# Patient Record
Sex: Male | Born: 1935 | Race: White | Hispanic: No | Marital: Married
Health system: Southern US, Community
[De-identification: ages and names within clinical notes are randomized; demographics above are authoritative.]

---

## 2004-02-22 ENCOUNTER — Ambulatory Visit: Payer: Self-pay

## 2006-08-23 ENCOUNTER — Ambulatory Visit: Payer: Self-pay | Admitting: Specialist

## 2007-01-29 ENCOUNTER — Ambulatory Visit: Payer: Self-pay | Admitting: Specialist

## 2007-07-18 ENCOUNTER — Ambulatory Visit: Payer: Self-pay | Admitting: Specialist

## 2008-08-25 ENCOUNTER — Ambulatory Visit: Payer: Self-pay | Admitting: Specialist

## 2008-08-28 ENCOUNTER — Emergency Department: Payer: Self-pay | Admitting: Emergency Medicine

## 2008-10-09 ENCOUNTER — Emergency Department: Payer: Self-pay | Admitting: Emergency Medicine

## 2008-10-21 ENCOUNTER — Ambulatory Visit: Payer: Self-pay | Admitting: Podiatry

## 2009-02-24 ENCOUNTER — Emergency Department: Payer: Self-pay | Admitting: Emergency Medicine

## 2009-05-27 ENCOUNTER — Inpatient Hospital Stay: Payer: Self-pay | Admitting: General Surgery

## 2009-05-28 ENCOUNTER — Ambulatory Visit: Payer: Self-pay | Admitting: Internal Medicine

## 2009-05-30 ENCOUNTER — Ambulatory Visit: Payer: Self-pay | Admitting: Internal Medicine

## 2009-06-07 ENCOUNTER — Ambulatory Visit: Payer: Self-pay | Admitting: Internal Medicine

## 2009-06-08 ENCOUNTER — Ambulatory Visit: Payer: Self-pay | Admitting: General Surgery

## 2009-06-30 ENCOUNTER — Ambulatory Visit: Payer: Self-pay | Admitting: Internal Medicine

## 2009-09-15 ENCOUNTER — Ambulatory Visit: Payer: Self-pay | Admitting: Internal Medicine

## 2009-09-30 ENCOUNTER — Ambulatory Visit: Payer: Self-pay | Admitting: Internal Medicine

## 2009-10-30 ENCOUNTER — Ambulatory Visit: Payer: Self-pay | Admitting: Internal Medicine

## 2009-11-23 ENCOUNTER — Ambulatory Visit: Payer: Self-pay | Admitting: Specialist

## 2010-03-02 ENCOUNTER — Ambulatory Visit: Payer: Self-pay | Admitting: Internal Medicine

## 2010-03-31 ENCOUNTER — Ambulatory Visit: Payer: Self-pay | Admitting: Internal Medicine

## 2010-05-25 ENCOUNTER — Ambulatory Visit: Payer: Self-pay | Admitting: Internal Medicine

## 2010-05-31 ENCOUNTER — Ambulatory Visit: Payer: Self-pay | Admitting: Internal Medicine

## 2010-07-20 ENCOUNTER — Ambulatory Visit: Payer: Self-pay | Admitting: Internal Medicine

## 2010-07-31 ENCOUNTER — Ambulatory Visit: Payer: Self-pay | Admitting: Internal Medicine

## 2010-09-14 ENCOUNTER — Ambulatory Visit: Payer: Self-pay | Admitting: Internal Medicine

## 2010-10-01 ENCOUNTER — Ambulatory Visit: Payer: Self-pay | Admitting: Internal Medicine

## 2010-10-31 ENCOUNTER — Ambulatory Visit: Payer: Self-pay | Admitting: Internal Medicine

## 2010-12-01 ENCOUNTER — Ambulatory Visit: Payer: Self-pay | Admitting: Internal Medicine

## 2011-01-04 ENCOUNTER — Ambulatory Visit: Payer: Self-pay | Admitting: Internal Medicine

## 2011-01-31 ENCOUNTER — Ambulatory Visit: Payer: Self-pay | Admitting: Internal Medicine

## 2011-03-22 ENCOUNTER — Ambulatory Visit: Payer: Self-pay | Admitting: Specialist

## 2011-03-29 ENCOUNTER — Ambulatory Visit: Payer: Self-pay | Admitting: Internal Medicine

## 2011-03-29 LAB — CBC CANCER CENTER
Eosinophil #: 0 x10 3/mm (ref 0.0–0.7)
Eosinophil %: 0.8 %
HCT: 35.5 % — ABNORMAL LOW (ref 40.0–52.0)
Lymphocyte #: 0.7 x10 3/mm — ABNORMAL LOW (ref 1.0–3.6)
MCH: 28.3 pg (ref 26.0–34.0)
MCHC: 32.9 g/dL (ref 32.0–36.0)
MCV: 86 fL (ref 80–100)
Monocyte #: 0.3 x10 3/mm (ref 0.0–0.7)
Neutrophil #: 2.9 x10 3/mm (ref 1.4–6.5)
RDW: 17.1 % — ABNORMAL HIGH (ref 11.5–14.5)

## 2011-03-31 ENCOUNTER — Ambulatory Visit: Payer: Self-pay | Admitting: Internal Medicine

## 2011-04-04 LAB — CBC CANCER CENTER
Basophil #: 0 x10 3/mm (ref 0.0–0.1)
Basophil %: 0.2 %
Eosinophil #: 0 x10 3/mm (ref 0.0–0.7)
HCT: 34.2 % — ABNORMAL LOW (ref 40.0–52.0)
HGB: 11.2 g/dL — ABNORMAL LOW (ref 13.0–18.0)
Lymphocyte %: 16.8 %
MCH: 28.7 pg (ref 26.0–34.0)
MCHC: 32.9 g/dL (ref 32.0–36.0)
Monocyte %: 7.2 %
Neutrophil #: 3.5 x10 3/mm (ref 1.4–6.5)
Neutrophil %: 75.1 %
WBC: 4.7 x10 3/mm (ref 3.8–10.6)

## 2011-05-01 ENCOUNTER — Ambulatory Visit: Payer: Self-pay | Admitting: Internal Medicine

## 2011-06-21 ENCOUNTER — Ambulatory Visit: Payer: Self-pay | Admitting: Internal Medicine

## 2011-06-21 LAB — IRON AND TIBC
Iron: 42 ug/dL — ABNORMAL LOW (ref 65–175)
Unbound Iron-Bind.Cap.: 318 ug/dL

## 2011-06-21 LAB — FERRITIN: Ferritin (ARMC): 29 ng/mL (ref 8–388)

## 2011-06-21 LAB — CBC CANCER CENTER
Basophil #: 0 x10 3/mm (ref 0.0–0.1)
Basophil %: 0.4 %
Eosinophil %: 0.6 %
Lymphocyte %: 22.2 %
MCH: 27.9 pg (ref 26.0–34.0)
MCHC: 31.3 g/dL — ABNORMAL LOW (ref 32.0–36.0)
Monocyte #: 0.5 x10 3/mm (ref 0.2–1.0)
Neutrophil #: 3.6 x10 3/mm (ref 1.4–6.5)
Neutrophil %: 67.5 %
Platelet: 42 x10 3/mm — ABNORMAL LOW (ref 150–440)
RBC: 4.23 10*6/uL — ABNORMAL LOW (ref 4.40–5.90)
RDW: 16.5 % — ABNORMAL HIGH (ref 11.5–14.5)
WBC: 5.4 x10 3/mm (ref 3.8–10.6)

## 2011-07-01 ENCOUNTER — Ambulatory Visit: Payer: Self-pay | Admitting: Internal Medicine

## 2011-07-31 ENCOUNTER — Ambulatory Visit: Payer: Self-pay | Admitting: Internal Medicine

## 2011-09-04 ENCOUNTER — Encounter: Payer: Self-pay | Admitting: Otolaryngology

## 2011-09-13 ENCOUNTER — Ambulatory Visit: Payer: Self-pay | Admitting: Internal Medicine

## 2011-09-13 LAB — CBC CANCER CENTER
Eosinophil #: 0.1 x10 3/mm (ref 0.0–0.7)
HCT: 39 % — ABNORMAL LOW (ref 40.0–52.0)
Lymphocyte %: 24.7 %
MCHC: 31.2 g/dL — ABNORMAL LOW (ref 32.0–36.0)
MCV: 90 fL (ref 80–100)
Monocyte %: 8.4 %
Neutrophil #: 3 x10 3/mm (ref 1.4–6.5)
Neutrophil %: 65.1 %
Platelet: 61 x10 3/mm — ABNORMAL LOW (ref 150–440)
RDW: 17.9 % — ABNORMAL HIGH (ref 11.5–14.5)
WBC: 4.6 x10 3/mm (ref 3.8–10.6)

## 2011-09-13 LAB — RETICULOCYTES: Absolute Retic Count: 0.0389 10*6/uL (ref 0.031–0.129)

## 2011-10-01 ENCOUNTER — Encounter: Payer: Self-pay | Admitting: Otolaryngology

## 2011-10-01 ENCOUNTER — Ambulatory Visit: Payer: Self-pay | Admitting: Internal Medicine

## 2011-10-18 ENCOUNTER — Other Ambulatory Visit: Payer: Self-pay | Admitting: Internal Medicine

## 2011-10-18 LAB — PROTIME-INR
INR: 1.1
Prothrombin Time: 14.2 secs (ref 11.5–14.7)

## 2011-12-06 ENCOUNTER — Ambulatory Visit: Payer: Self-pay | Admitting: Internal Medicine

## 2011-12-06 LAB — CBC CANCER CENTER
Basophil %: 0.5 %
Eosinophil #: 0.1 x10 3/mm (ref 0.0–0.7)
Eosinophil %: 1.6 %
HCT: 35.9 % — ABNORMAL LOW (ref 40.0–52.0)
Lymphocyte #: 0.8 x10 3/mm — ABNORMAL LOW (ref 1.0–3.6)
MCH: 28.8 pg (ref 26.0–34.0)
MCHC: 31.4 g/dL — ABNORMAL LOW (ref 32.0–36.0)
MCV: 92 fL (ref 80–100)
Monocyte #: 0.3 x10 3/mm (ref 0.2–1.0)
Neutrophil #: 2.7 x10 3/mm (ref 1.4–6.5)
Platelet: 102 x10 3/mm — ABNORMAL LOW (ref 150–440)
RDW: 17 % — ABNORMAL HIGH (ref 11.5–14.5)

## 2011-12-31 ENCOUNTER — Ambulatory Visit: Payer: Self-pay | Admitting: Internal Medicine

## 2012-01-17 ENCOUNTER — Emergency Department: Payer: Self-pay | Admitting: Emergency Medicine

## 2012-01-31 ENCOUNTER — Ambulatory Visit: Payer: Self-pay | Admitting: Internal Medicine

## 2012-03-02 ENCOUNTER — Ambulatory Visit: Payer: Self-pay | Admitting: Internal Medicine

## 2012-03-20 LAB — IRON AND TIBC
Iron Bind.Cap.(Total): 304 ug/dL (ref 250–450)
Iron: 33 ug/dL — ABNORMAL LOW (ref 65–175)
Unbound Iron-Bind.Cap.: 271 ug/dL

## 2012-03-20 LAB — FERRITIN: Ferritin (ARMC): 39 ng/mL (ref 8–388)

## 2012-03-30 ENCOUNTER — Ambulatory Visit: Payer: Self-pay | Admitting: Internal Medicine

## 2012-04-09 ENCOUNTER — Inpatient Hospital Stay: Payer: Self-pay | Admitting: Internal Medicine

## 2012-04-09 LAB — TROPONIN I: Troponin-I: 0.03 ng/mL

## 2012-04-09 LAB — URINALYSIS, COMPLETE
Glucose,UR: NEGATIVE mg/dL (ref 0–75)
Nitrite: NEGATIVE
Protein: 30
RBC,UR: 21 /HPF (ref 0–5)
Specific Gravity: 1.023 (ref 1.003–1.030)
WBC UR: 239 /HPF (ref 0–5)

## 2012-04-09 LAB — COMPREHENSIVE METABOLIC PANEL
Albumin: 2.6 g/dL — ABNORMAL LOW (ref 3.4–5.0)
Alkaline Phosphatase: 73 U/L (ref 50–136)
Anion Gap: 2 — ABNORMAL LOW (ref 7–16)
Bilirubin,Total: 0.5 mg/dL (ref 0.2–1.0)
Calcium, Total: 8.1 mg/dL — ABNORMAL LOW (ref 8.5–10.1)
Chloride: 107 mmol/L (ref 98–107)
Co2: 30 mmol/L (ref 21–32)
Creatinine: 0.79 mg/dL (ref 0.60–1.30)
EGFR (African American): >60
Glucose: 108 mg/dL — ABNORMAL HIGH (ref 65–99)
Sodium: 139 mmol/L (ref 136–145)
Total Protein: 6.8 g/dL (ref 6.4–8.2)

## 2012-04-09 LAB — CBC
HCT: 36.4 % — ABNORMAL LOW (ref 40.0–52.0)
MCH: 27.9 pg (ref 26.0–34.0)
MCHC: 31.4 g/dL — ABNORMAL LOW (ref 32.0–36.0)
MCV: 89 fL (ref 80–100)
RBC: 4.09 10*6/uL — ABNORMAL LOW (ref 4.40–5.90)
RDW: 17.2 % — ABNORMAL HIGH (ref 11.5–14.5)
WBC: 12.8 10*3/uL — ABNORMAL HIGH (ref 3.8–10.6)

## 2012-04-09 LAB — CK: CK, Total: 32 U/L — ABNORMAL LOW (ref 35–232)

## 2012-04-10 LAB — BASIC METABOLIC PANEL
Anion Gap: 4 — ABNORMAL LOW (ref 7–16)
BUN: 26 mg/dL — ABNORMAL HIGH (ref 7–18)
Chloride: 105 mmol/L (ref 98–107)
Co2: 30 mmol/L (ref 21–32)
Creatinine: 0.93 mg/dL (ref 0.60–1.30)
EGFR (African American): >60
EGFR (Non-African Amer.): >60
Sodium: 139 mmol/L (ref 136–145)

## 2012-04-10 LAB — CBC WITH DIFFERENTIAL/PLATELET
Basophil #: 0 10*3/uL (ref 0.0–0.1)
Basophil %: 0.1 %
Eosinophil #: 0 10*3/uL (ref 0.0–0.7)
HCT: 34.3 % — ABNORMAL LOW (ref 40.0–52.0)
HGB: 11.3 g/dL — ABNORMAL LOW (ref 13.0–18.0)
MCH: 29.3 pg (ref 26.0–34.0)
MCV: 89 fL (ref 80–100)
Monocyte #: 0.5 x10 3/mm (ref 0.2–1.0)
Monocyte %: 7.3 %
Neutrophil #: 5.3 10*3/uL (ref 1.4–6.5)
Neutrophil %: 84.6 %
Platelet: 52 10*3/uL — ABNORMAL LOW (ref 150–440)
RBC: 3.87 10*6/uL — ABNORMAL LOW (ref 4.40–5.90)
RDW: 17 % — ABNORMAL HIGH (ref 11.5–14.5)

## 2012-04-11 LAB — CBC WITH DIFFERENTIAL/PLATELET
Basophil #: 0 10*3/uL (ref 0.0–0.1)
Basophil %: 0 %
Eosinophil #: 0 10*3/uL (ref 0.0–0.7)
HCT: 33.9 % — ABNORMAL LOW (ref 40.0–52.0)
Lymphocyte #: 0.1 10*3/uL — ABNORMAL LOW (ref 1.0–3.6)
Lymphocyte %: 2.1 %
MCH: 27.8 pg (ref 26.0–34.0)
MCV: 89 fL (ref 80–100)
Monocyte #: 0.1 x10 3/mm — ABNORMAL LOW (ref 0.2–1.0)
Monocyte %: 0.9 %
Neutrophil #: 5.8 10*3/uL (ref 1.4–6.5)
RBC: 3.82 10*6/uL — ABNORMAL LOW (ref 4.40–5.90)
RDW: 16.4 % — ABNORMAL HIGH (ref 11.5–14.5)
WBC: 6 10*3/uL (ref 3.8–10.6)

## 2012-04-11 LAB — BASIC METABOLIC PANEL
Anion Gap: 2 — ABNORMAL LOW (ref 7–16)
Calcium, Total: 8.4 mg/dL — ABNORMAL LOW (ref 8.5–10.1)
Co2: 32 mmol/L (ref 21–32)
Creatinine: 0.89 mg/dL (ref 0.60–1.30)
EGFR (African American): >60
EGFR (Non-African Amer.): >60
Glucose: 163 mg/dL — ABNORMAL HIGH (ref 65–99)
Osmolality: 284 (ref 275–301)

## 2012-04-11 LAB — URINE CULTURE

## 2012-04-12 LAB — EXPECTORATED SPUTUM ASSESSMENT W GRAM STAIN, RFLX TO RESP C

## 2012-04-12 LAB — CBC WITH DIFFERENTIAL/PLATELET
Basophil #: 0 10*3/uL (ref 0.0–0.1)
Eosinophil #: 0 10*3/uL (ref 0.0–0.7)
Eosinophil %: 0 %
HGB: 10.9 g/dL — ABNORMAL LOW (ref 13.0–18.0)
Lymphocyte %: 1.3 %
MCH: 28.1 pg (ref 26.0–34.0)
Neutrophil #: 9 10*3/uL — ABNORMAL HIGH (ref 1.4–6.5)
Platelet: 72 10*3/uL — ABNORMAL LOW (ref 150–440)
RBC: 3.89 10*6/uL — ABNORMAL LOW (ref 4.40–5.90)
RDW: 16.7 % — ABNORMAL HIGH (ref 11.5–14.5)

## 2012-04-12 LAB — BASIC METABOLIC PANEL
Anion Gap: 1 — ABNORMAL LOW (ref 7–16)
BUN: 25 mg/dL — ABNORMAL HIGH (ref 7–18)
Calcium, Total: 8.3 mg/dL — ABNORMAL LOW (ref 8.5–10.1)
Co2: 34 mmol/L — ABNORMAL HIGH (ref 21–32)
Creatinine: 0.83 mg/dL (ref 0.60–1.30)
EGFR (African American): >60
EGFR (Non-African Amer.): >60
Glucose: 152 mg/dL — ABNORMAL HIGH (ref 65–99)
Osmolality: 279 (ref 275–301)
Potassium: 4.7 mmol/L (ref 3.5–5.1)

## 2012-04-14 LAB — CULTURE, BLOOD (SINGLE)

## 2012-06-30 DEATH — deceased

## 2014-05-22 NOTE — Discharge Summary (Signed)
Dates of Admission and Diagnosis:  Date of Admission 09-Apr-2012   Date of Discharge 12-Apr-2012   Admitting Diagnosis conbfusion   Final Diagnosis acute on chronic respiratory failure   Discharge Diagnosis 1 copd flair   2 cardiomyopathy with effusion   3 Endobronchial mass suggested by ct   4 encephalopathy    Chief Complaint/History of Present Illness see h and p     Hepatic:  11-Mar-14 00:29   Bilirubin, Total 0.5  Alkaline Phosphatase 73  SGPT (ALT) 15  SGOT (AST) 15  Total Protein, Serum 6.8  Albumin, Serum  2.6  LabUnknown:  12-Mar-14 08:47   Ind. Clindamycin Resistance NEGATIVE  Routine Micro:  11-Mar-14 00:29   Micro Text Report URINE CULTURE   COMMENT                   MIXED BACTERIAL ORGANISMS   COMMENT                   RESULTS SUGGESTIVE OF CONTAMINATION   ANTIBIOTIC                       Culture Comment MIXED BACTERIAL ORGANISMS  Specimen Source CLEAN CATCH  Culture Comment . RESULTS SUGGESTIVE OF CONTAMINATION  Result(s) reported on 11 Apr 2012 at 10:36AM.    04:04   Micro Text Report BLOOD CULTURE   COMMENT                   NO GROWTH IN 48 HOURS   ANTIBIOTIC                       Micro Text Report BLOOD CULTURE   COMMENT                   NO GROWTH IN 48 HOURS   ANTIBIOTIC                       Culture Comment NO GROWTH IN 72 HOURS  Result(s) reported on 11 Apr 2012 at 07:07AM.  Culture Comment NO GROWTH IN 48 HOURS  Result(s) reported on 11 Apr 2012 at 07:07AM.  12-Mar-14 08:47   Organism Name STAPHYLOCOCCUS AUREUS  Organism Quantity MODERATE GROWTH  Clindamycin Sensitivity S  Oxacillin Sensitivity S  Ciprofloxacin Sensitivity R  Gentamicin Sensitivity S  Erythromycin Sensitivity R  Linezolid Sensitivity S  Tigecycline Sensitivity S  Trimethoprim/Sulfamethoxazole Sensitivty S  Cefoxitin Scrn. NEGATIVE  Lefofloxacin Sensitivity R  Micro Text Report SPUTUM CULTURE   ORGANISM 1                MODERATE GROWTH STAPHYLOCOCCUS  AUREUS   COMMENT                   -   GRAM STAIN                GOOD SPECIMEN-80-90% WBC   GRAM STAIN                MANY WHITE BLOOD CELLS   GRAM STAIN                MODERATE GRAM POSITIVE COCCI IN PAIRS   GRAM STAIN                MODERATE GRAM POSITIVE COCCI IN CLUSTERS   ANTIBIOTIC  ORG#1     CIPROFLOXACIN                 R         CLINDAMYCIN                   S         ERYTHROMYCIN           R         GENTAMICIN                    S         LEVOFLOXACIN                  R         LINEZOLID                     S         OXACILLIN                     S         TIGECYCLINE                   S         CEFOXITIN SCREEN      NEGATIVE  INDUCIBLE CLINDAMYCIN RESISTANNEGATIVE  TRIMETHOPRIM/SULFAMETHOXAZOLE S  Organism 1 MODERATE GROWTH STAPHYLOCOCCUS AUREUS  Culture Comment -  Gram Stain 1 GOOD SPECIMEN-80-90% WBC  Gram Stain 2 MANY WHITE BLOOD CELLS  Gram Stain 3 MODERATE GRAM POSITIVE COCCI IN PAIRS  Gram Stain 4 MODERATE GRAM POSITIVE COCCI IN CLUSTERS  Result(s) reported on 12 Apr 2012 at 10:55AM.  Lab:  11-Mar-14 04:20   pH (ABG) 7.41  PCO2 43  PO2  63  FiO2 21  Base Excess 2.2  HCO3  27.3  O2 Saturation 90.2  O2 Device Room Air  Specimen Site (ABG) RT RADIAL  Specimen Type (ABG) ARTERIAL  Patient Temp (ABG) 37.0 (Result(s) reported on 09 Apr 2012 at 04:34AM.)  Cardiology:  10-Mar-14 23:48   Ventricular Rate 77  Atrial Rate 77  P-R Interval 148  QRS Duration 130  QT 386  QTc 436  P Axis 58  R Axis -64  T Axis 127  ECG interpretation Normal sinus rhythm Possible Left atrial enlargement Non-specific intra-ventricular conduction block Cannot rule out Inferior infarct (masked by fascicular block?) , age undetermined Cannot rule out Anterior infarct (cited on or before 04-Sep-2002) T wave abnormality, consider lateral ischemia Abnormal ECG When compared with ECG of 26-May-2009 21:19, No significant change was  found ----------unconfirmed---------- Confirmed by OVERREAD, NOT ([id number]), editor PEARSON, BARBARA ([id number]) on 04/09/2012 1:31:40 PM  Routine Chem:  11-Mar-14 00:29   Glucose, Serum  108  BUN  25  Creatinine (comp) 0.79  Sodium, Serum 139  Potassium, Serum 4.9  Chloride, Serum 107  CO2, Serum 30  Calcium (Total), Serum  8.1  Anion Gap  2  Osmolality (calc) 282  eGFR (African American) >60  eGFR (Non-African American) >60 (eGFR values <66mL/min/1.73 m2 may be an indication of chronic kidney disease (CKD). Calculated eGFR is useful in patients with stable renal function. The eGFR calculation will not be reliable in acutely ill patients when serum creatinine is changing rapidly. It is not useful in  patients on dialysis. The eGFR calculation may not be applicable to patients at the low and high extremes of body sizes, pregnant women, and vegetarians.)  B-Type Natriuretic  Peptide [facility])  [phone] (Result(s) reported on 09 Apr 2012 at 04:55AM.)  12-Mar-14 06:56   Glucose, Serum 83  BUN  26  Creatinine (comp) 0.93  Sodium, Serum 139  Potassium, Serum 4.0  Chloride, Serum 105  CO2, Serum 30  Calcium (Total), Serum  8.3  Anion Gap  4  Osmolality (calc) 281  eGFR (African American) >60  eGFR (Non-African American) >60 (eGFR values <68mL/min/1.73 m2 may be an indication of chronic kidney disease (CKD). Calculated eGFR is useful in patients with stable renal function. The eGFR calculation will not be reliable in acutely ill patients when serum creatinine is changing rapidly. It is not useful in  patients on dialysis. The eGFR calculation may not be applicable to patients at the low and high extremes of body sizes, pregnant women, and vegetarians.)  13-Mar-14 04:28   Glucose, Serum  163  BUN  27  Creatinine (comp) 0.89  Sodium, Serum 138  Potassium, Serum 4.5  Chloride, Serum 104  CO2, Serum 32  Calcium (Total), Serum  8.4  Anion Gap  2  Osmolality (calc) 284  eGFR (African American)  >60  eGFR (Non-African American) >60 (eGFR values <3mL/min/1.73 m2 may be an indication of chronic kidney disease (CKD). Calculated eGFR is useful in patients with stable renal function. The eGFR calculation will not be reliable in acutely ill patients when serum creatinine is changing rapidly. It is not useful in  patients on dialysis. The eGFR calculation may not be applicable to patients at the low and high extremes of body sizes, pregnant women, and vegetarians.)  Result Comment potassium - Slight hemolysis, interpret results with  - caution.  Result(s) reported on 11 Apr 2012 at 05:17AM.  14-Mar-14 05:35   Glucose, Serum  152  BUN  25  Creatinine (comp) 0.83  Sodium, Serum 136  Potassium, Serum 4.7  Chloride, Serum 101  CO2, Serum  34  Calcium (Total), Serum  8.3  Anion Gap  1  Osmolality (calc) 279  eGFR (African American) >60  eGFR (Non-African American) >60 (eGFR values <93mL/min/1.73 m2 may be an indication of chronic kidney disease (CKD). Calculated eGFR is useful in patients with stable renal function. The eGFR calculation will not be reliable in acutely ill patients when serum creatinine is changing rapidly. It is not useful in  patients on dialysis. The eGFR calculation may not be applicable to patients at the low and high extremes of body sizes, pregnant women, and vegetarians.)  Cardiac:  11-Mar-14 00:29   CK, Total  32 (Result(s) reported on 09 Apr 2012 at 03:34AM.)  Troponin I 0.03 (0.00-0.05 0.05 ng/mL or less: NEGATIVE  Repeat testing in 3-6 hrs  if clinically indicated. >0.05 ng/mL: POTENTIAL  MYOCARDIAL INJURY. Repeat  testing in 3-6 hrs if  clinically indicated. NOTE: An increase or decrease  of 30% or more on serial  testing suggests a  clinically important change)  Routine UA:  11-Mar-14 00:29   Color (UA) Amber  Clarity (UA) Cloudy  Glucose (UA) Negative  Bilirubin (UA) Negative  Ketones (UA) Trace  Specific Gravity (UA) 1.023  Blood  (UA) 1+  pH (UA) 5.0  Protein (UA) 30 mg/dL  Nitrite (UA) Negative  Leukocyte Esterase (UA) 3+ (Result(s) reported on 09 Apr 2012 at 01:25AM.)  RBC (UA) 21 /HPF  WBC (UA) 239 /HPF  Bacteria (UA) TRACE  Epithelial Cells (UA) 1 /HPF  Mucous (UA) PRESENT  Budding Yeast (UA) PRESENT (Result(s) reported on 09 Apr 2012 at 01:25AM.)  Routine Hem:  11-Mar-14 00:29   WBC (CBC)  12.8  RBC (CBC)  4.09  Hemoglobin (CBC)  11.4  Hematocrit (CBC)  36.4  Platelet Count (CBC)  59 (Result(s) reported on 09 Apr 2012 at 12:42AM.)  MCV 89  MCH 27.9  MCHC  31.4  RDW  17.2  12-Mar-14 06:56   WBC (CBC) 6.2  RBC (CBC)  3.87  Hemoglobin (CBC)  11.3  Hematocrit (CBC)  34.3  Platelet Count (CBC)  52  MCV 89  MCH 29.3  MCHC 33.0  RDW  17.0  Neutrophil % 84.6  Lymphocyte % 7.9  Monocyte % 7.3  Eosinophil % 0.1  Basophil % 0.1  Neutrophil # 5.3  Lymphocyte #  0.5  Monocyte # 0.5  Eosinophil # 0.0  Basophil # 0.0 (Result(s) reported on 10 Apr 2012 at 07:44AM.)  13-Mar-14 04:28   WBC (CBC) 6.0  RBC (CBC)  3.82  Hemoglobin (CBC)  10.6  Hematocrit (CBC)  33.9  Platelet Count (CBC)  56  MCV 89  MCH 27.8  MCHC  31.3  RDW  16.4  Neutrophil % 97.0  Lymphocyte % 2.1  Monocyte % 0.9  Eosinophil % 0.0  Basophil % 0.0  Neutrophil # 5.8  Lymphocyte #  0.1  Monocyte #  0.1  Eosinophil # 0.0  Basophil # 0.0 (Result(s) reported on 11 Apr 2012 at 05:17AM.)  14-Mar-14 05:35   WBC (CBC) 9.3  RBC (CBC)  3.89  Hemoglobin (CBC)  10.9  Hematocrit (CBC)  34.5  Platelet Count (CBC)  72  MCV 89  MCH 28.1  MCHC  31.6  RDW  16.7  Neutrophil % 96.8  Lymphocyte % 1.3  Monocyte % 1.8  Eosinophil % 0.0  Basophil % 0.1  Neutrophil #  9.0  Lymphocyte #  0.1  Monocyte # 0.2  Eosinophil # 0.0  Basophil # 0.0 (Result(s) reported on 12 Apr 2012 at 07:08AM.)   PERTINENT RADIOLOGY STUDIES: XRay:    18-Dec-13 19:59, Elbow Left Complete  Elbow Left Complete   REASON FOR EXAM:     fall/swelling  COMMENTS:       PROCEDURE: DXR - DXR ELBOW LT COMP W/OBLIQUES  - Jan 17 2012  7:59PM     RESULT: There is no evidence of fracture, dislocation, or malalignment.    IMPRESSION:     1. No evidence of acute abnormalities.   2. If there are persistent complaints of pain or persistent clinical   concern, a repeat evaluation in 7-10 days is recommended if clinically   warranted.     Thank you for the opportunity to contribute to the care of your patient.    Verified By: Mikki Santee, M.D., MD    11-Mar-14 01:58, Chest PA and Lateral  Chest PA and Lateral   REASON FOR EXAM:    cough, slurred speech  and AMS eval pna h/o COPD  COMMENTS:       PROCEDURE: DXR - DXR CHEST PA (OR AP) AND LATERAL  - Apr 09 2012  1:58AM     RESULT: Comparison is made to the study of May 4,2011.    The lungs are hypoinflated. The hemidiaphragms are secured. There is   increased density in the left perihilar region as compared to the earlier   study. The cardiac silhouette is obscured. The patient has undergone   previous CABG.    IMPRESSION:  The study is limited due to hypoinflation. Bibasilar   pneumonia is present with bilateral pleural effusions. I  cannot exclude   an element of low-grade CHF in the appropriate clinical setting.   Dictation Site: 1        Verified By: DAVID A. Martinique, M.D., MD  LabUnknown:    13-Mar-14 09:02, CT Chest Without Contrast  PACS Image   CT:    18-Dec-13 20:02, CT Head Without Contrast  CT Head Without Contrast   REASON FOR EXAM:    fall  COMMENTS:       PROCEDURE: CT  - CT HEAD WITHOUT CONTRAST  - Jan 17 2012  8:02PM     RESULT: Technique: Helical noncontrasted 5 mm sections were obtained from   the skull base through the vertex.    Findings: Diffuse cortical and cerebellar atrophy is identified as well   as diffuse areas of low attenuation within the subcortical, deep and   periventricular white matter regions. There is not evidence of    intra-axial nor extra-axial fluid collections, acute hemorrhage, mass   effect, nor a depressed skull fracture. The visualized paranasal sinuses   and mastoid air cells are patent. A scalp hematoma is identified within   the left frontal region.  IMPRESSION:  Chronic and involutional changes without evidence of acute   abnormalities. If there is persistent clinical concern further evaluation   with MRI is recommended.           Thank you for the opportunity to contribute to the care of your patient.        Verified By: Mikki Santee, M.D., MD    11-Mar-14 01:51, CT Head Without Contrast  CT Head Without Contrast   REASON FOR EXAM:    slurred speech and AMS since 1600 eval  COMMENTS:       PROCEDURE: CT  - CT HEAD WITHOUT CONTRAST  - Apr 09 2012  1:51AM     RESULT: Technique: Helical noncontrasted 5 mm sections were obtained from   the skull base through the vertex.    Findings: Diffuse cortical and cerebellar atrophy is identified as well   as diffuse areas of low attenuation within the subcortical, deep and   periventricular white matter regions. There is not evidence of   intra-axial nor extra-axial fluid collections, acute hemorrhage, mass   effect, nor a depressed skull fracture. The visualized paranasal sinuses   and mastoid air cells are patent.  IMPRESSION:  Chronic and involutional changes without evidence of acute   abnormalities. If there is persistent clinical concern further evaluation   with MRI is recommended.     2. Comparison made to prior study dated 01/17/2012.  3. Dr. Dahlia Client of the emergency department was informed of these findings   via a preliminary faxed report.          Thank you for the opportunity to contribute to the care of your patient.        Verified By: Mikki Santee, M.D., MD    13-Mar-14 09:02, CT Chest Without Contrast  CT Chest Without Contrast   REASON FOR EXAM:    sob, left chest consolidation ? mass  COMMENTS:        PROCEDURE: CT  - CT CHEST WITHOUT CONTRAST  - Apr 11 2012  9:02AM     RESULT: Chest CT without contrast is compared to previous examination   dated 22 March 2011. There is apersistent moderately large right   pleural effusion with trace left pleural effusion. There is left lower   lobe consolidation with air bronchograms progressing since  the previous   exam. There is patchy infiltrate in the left upper lobe extending into   the lingula where there is greater consolidation. Respiratory motion   artifact is present. There is compressive atelectasis in the right lower   lobe and in the right upper lobe. There is narrowing in the left mainstem   bronchus which was not present previously. This is best appreciated on   images 39 to 43. The heart is enlarged. The esophagus is distended with     fluid and air. The distal portion is poorly seen. Surgical clips are seen   in the gastroesophageal junction region. CABG changes are present.   Portion of colon and stomach appear to be present in the thoracic region   posteriorly. Degenerative disc narrowing is seen diffusely in the   thoracic spine with mild compression deformity involving the T10   vertebral body.    IMPRESSION:  Moderate to large right pleural effusion persists. There is   herniation of the stomach and colon in the posterior thoracic region as   noted previously. There is progressive consolidation in the left lower   lobe with narrowing of the leftmainstem bronchus. This is difficult to   further delineate given the lack of contrast. The heart remains enlarged.   CABG changes are present. The esophageal dilation is noted proximally.   There is compression deformity in the T10 vertebral body.  Dictation Site: 1        Verified By: Sundra Aland, M.D., MD   Hospital Course:  Hospital Course 81 year old male with severe copd and bronchietasis and dementia admitted with worsening confusion and cough and sob. He  improved a good deal with iv steriods. He contineued to have a large pleuroal effusion, severe cm noted. His encephalopathy cleared nicely with levaquin. Was found to have a ? endobronchial mass in the left chest. He was not thought to be able to tolerate further work up or treatment of this and given his other severe medical problems will dc him to home with hospice after discussion with his wife and with Dr Vella Kohler as well. Note it took 35 min to do dc work today   Condition on Discharge Satisfactory   DISCHARGE INSTRUCTIONS HOME MEDS:  Medication Reconciliation: Patient's Home Medications at Discharge:     Medication Instructions  omeprazole 20 mg oral delayed release capsule    2 times a day    advair  as needed     atorvastatin 40 mg oral tablet  1 tab(s) orally once a day (at bedtime)   ferrous sulfate 325 mg oral delayed release tablet  1 tab(s) orally 2 times a day   quetiapine 25 mg oral tablet  1  orally    levothyroxine 50 mcg (0.05 mg) oral tablet  2 tab(s) orally once a day   torsemide 10 mg oral tablet  1 tab(s) orally once a day   levofloxacin 250 mg oral tablet  1 tab(s) orally once a day   prednisone 20 mg oral tablet  3 tab(s) orally once a day    STOP TAKING THE FOLLOWING MEDICATION(S):   after taking the 3 prednsione tabs daily then 2 a day for 5 days then 1 a day for 5 days then 1/2 daily going forward  Physician's Instructions:  Diet Regular   Activity Limitations As tolerated   Return to Work Not Applicable   Time frame for Follow Up Appointment per orders   Electronic Signatures: Frazier Richards  W (MD)  (Signed 14-Mar-14 13:55)  Authored: ADMISSION DATE AND DIAGNOSIS, CHIEF COMPLAINT/HPI, PERTINENT LABS, PERTINENT RADIOLOGY STUDIES, HOSPITAL COURSE, DISCHARGE INSTRUCTIONS HOME MEDS, PATIENT INSTRUCTIONS   Last Updated: 14-Mar-14 13:55 by Kirk Ruths (MD)

## 2014-05-22 NOTE — H&P (Signed)
PATIENT NAME:  Derrick Price, BARUCH MR#:  [id number] DATE OF BIRTH:  06-Sep-1924  DATE OF ADMISSION:  04/09/2012   PRIMARY CARE PHYSICIAN: Frazier Richards, M.D.  REFERRING PHYSICIAN: Charlesetta Ivory.   CHIEF COMPLAINT: Hallucination and altered mental status.   HISTORY OF PRESENT ILLNESS: The patient is an 81 year old pleasant Caucasian male with a history of chronic obstructive pulmonary disease, coronary artery disease, congestive cardiomyopathy and hypothyroidism. The patient lives at home with his wife, and he was doing reasonably well at his usual state of condition until 4:00 p.m. yesterday, that is on Monday, when he appeared to be delirious. He was hallucinating, seeing people at home. He was also noted to have generalized weakness and could not walk in the usual way. Also, in the last few days noted to have some cough without sputum production. He had no fever, no chills, no reported chest pain, no vomiting, no diarrhea. Most of the historical information was taken from his wife. The patient is sleepy but arousable. He is hard of hearing, and it was difficult to communicate, but he is alert and he knows that he is at the hospital.   REVIEW OF SYSTEMS:  CONSTITUTIONAL: Denies any fever. No chills, but he has some fatigue.  EYES: No blurring of vision. No double vision. However, he had some visual hallucinations earlier. Again, visual hallucinations earlier.  ENT: He has a chronic hearing impairment that is bilateral. No sore throat. No dysphagia.  CARDIOVASCULAR: Denies any chest pain. Denies even any shortness of breath. Noted to have some ankle edema. This is chronic for him. No syncope.  RESPIRATORY: Had some dry cough in the last few days. No chest pain. No shortness of breath.  GASTROINTESTINAL: No abdominal pain, no vomiting, no diarrhea.  GENITOURINARY: No dysuria. No frequency of urination.  MUSCULOSKELETAL: No joint pain or swelling. No muscular pain or swelling.  INTEGUMENTARY: No  skin rash. No ulcers.  NEUROLOGY: No focal weakness, no seizure activity, no headache, but reported some delirium and visual hallucinations earlier. Right now, the patient looks at his usual state.  PSYCHIATRY: No anxiety. No depression.  ENDOCRINE: No polyuria or polydipsia. No heat or cold intolerance.   PAST MEDICAL HISTORY: Chronic obstructive pulmonary disease, coronary artery disease, status post coronary artery bypass graft, ischemic and congestive cardiomyopathy with ejection fraction of 35% by echocardiogram in 2011. Severe mitral regurgitation. Moderate to severe tricuspid regurgitation and mild to moderate aortic incompetence. Hypothyroidism, benign prostatic hypertrophy, history of esophageal and gastric cancer status post partial gastrectomy. The patient also has thrombocytopenia for a few years. He had workup with Dr. Ma Hillock. He had iron studies, B12, folate, bone marrow biopsy and flow cytometry studies. All were nonrevealing. At the end it was thought possibly ITP.   PAST SURGICAL HISTORY: Coronary artery bypass graft, cholecystectomy, hernia repair.   FAMILY HISTORY: His father died from bone cancer, unsure if it was leukemia. His mother suffered from Alzheimer dementia.   SOCIAL HISTORY: The patient is married, living with his wife. He is retired from working with [employer].   SOCIAL HABITS: Remote history of smoking, quit many years ago. No history of alcohol or drug abuse.   ADMISSION MEDICATIONS: Torsemide 10 mg daily, quetiapine 25 mg once daily, omeprazole 40 mg once daily, levothyroxine 100 mg once daily, ferrous sulfate 325 mg twice daily, atorvastatin 40 mg daily, Advair Diskus used p.r.n.   ALLERGIES: No known drug allergies.   PHYSICAL EXAMINATION:  VITAL SIGNS: Blood pressure 95/48, respiratory rate 18,  pulse 73, temperature 98, oxygen saturation 95%.  GENERAL APPEARANCE: Elderly male, frail looking and lying in bed in no acute distress.  HEAD: No pallor, no  icterus, no cyanosis.  EARS: Hard of hearing, which is a bilateral hearing impairment. No visible lesions, no ulcers, no discharge.  NOSE: Nasal mucosa was normal without ulcers, no discharge, no bleeding.  MOUTH: Oropharyngeal area was normal without any ulcers, no oral thrush. He is edentulous, and he is not wearing his dentures.  EYES: Eye examination revealed normal eyelids and conjunctivae. Pupils are constricted. I could not see reactivity to light.  NECK: Supple. Trachea at midline. No thyromegaly, no cervical lymphadenopathy, no masses.  HEART: Normal S1, S2. No S3, S4. I did not appreciate any murmur despite history of severe mitral regurgitation. No gallop, no carotid bruits.  RESPIRATORY: Normal breathing pattern without use of accessory muscles. No rales, no wheezing. There are decreased breathing sounds bilaterally at the bases.  ABDOMEN: Soft without tenderness. No hepatosplenomegaly, no masses, no hernias.  SKIN: No ulcers, no subcutaneous nodules.  MUSCULOSKELETAL: No joint swelling, no clubbing.  NEUROLOGIC: Cranial nerves II through XII are intact except for decreased hearing bilaterally. No focal motor deficit.  PSYCHIATRIC EVALUATION: The patient is arousable, communicates reasonably. He is calm. Communication was difficult because of the bilateral hearing impairment. There is no focal motor deficit. The patient is calm right now. He understands that he is in the hospital, and he was able to recognize his wife at the bedside and he told me her name. Denies any discomfort. Mood and affect appears to be normal.   LABORATORY FINDINGS AND RADIOLOGIC DATA: His EKG showed normal sinus rhythm at a rate of 77/minute. Old inferior myocardial infarction and left axis deviation. Poor progression of R waves in the anterior chest leads. Nonspecific ST and T-wave abnormalities in the lateral leads. His chest x-ray showed bilateral pleural effusion, pulmonary vascular congestion. I cannot rule out  underlying pneumonia. CAT scan of the head showed no acute intracranial hemorrhage or acute finding. Serum glucose 108, BUN 25, creatinine 0.7, sodium 139, potassium 4.9. Calcium 8.1, albumin is 2.6, total protein 6.8. Normal liver transaminases. Total CPK 32 with normal troponin 0.03. CBC showed a white count of 12,800, hemoglobin 11, hematocrit 36, platelet count 59. His baseline platelets are ranging between 40 to 100,000. His MCV and MCH are normal. Urinalysis showed cloudy urine, +3 leukocyte esterase, 239 white blood cells. ABG showed pH of 7.41, pCO2 43, pO2 63. This was on room air.   ASSESSMENT:  1. Acute delirium associated with visual hallucinations, likely secondary to underlying infection. In this case, it appears to be a urinary tract infection.  2. Bilateral pleural effusion, most likely secondary to his underlying congestive cardiomyopathy. There is also pulmonary vascular congestion. I cannot rule out underlying pneumonia.  3. Severe congestive cardiomyopathy with ejection fraction of 35% by echocardiogram in 2011.  4. Severe mitral regurgitation and moderate to severe tricuspid regurgitation.   ASSESSMENT:  1. Acute delirium associated with visual hallucinations. This appears improving since he came to the Emergency Department and he got some IV fluids and antibiotics.  2. Bilateral pleural effusion associated also with vascular pulmonary congestion secondary to congestive heart failure. I cannot rule out underlying pneumonia.  3. Congestive cardiomyopathy with ejection fraction of 35%.  4. Severe mitral regurgitation and moderate tricuspid regurgitation.  5. Urinary tract infection.  6. Chronic obstructive pulmonary disease.  7. Coronary artery disease, status post coronary artery bypass graft.  8. 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 He had full workup with Dr. Ma Hillock.  9. Hypothyroidism.  10. Hyperlipidemia.  11. History of coronary artery bypass graft and cholecystectomy.    PLAN: Blood cultures and urine culture were taken. IV antibiotic using Levaquin was started. This is pending results of the cultures. I will check his BNP. I will consult pulmonary, Dr. Mortimer Fries, to evaluate the pulmonary findings. I will continue his home medications as listed above. I will avoid IV fluids given his congestive cardiomyopathy and chest x-ray findings. Follow up on his neurologic status with frequent neurologic checkups. Right now, the wife agrees that he is calmer than before and he looks appropriate. Hopefully, the whole situation is secondary to his urinary tract infection and that this can be treated easily. I would like to mention that I reviewed his medical records on his prior to her admissions. In the records, his CODE STATUS is DO NOT RESUSCITATE. This remains effective now, and his wife confirmed that.   Time spent on evaluating this patient took more than 1 hour including reviewing his medic medical records and also discussion with his wife.    ____________________________ Clovis Pu. Lenore Manner, MD amd:lo D: 04/09/2012 05:46:35 ET T: 04/09/2012 10:19:21 ET JOB#: [id number]  cc: Clovis Pu. Lenore Manner, MD, <Dictator> Ellin Saba MD ELECTRONICALLY SIGNED 04/10/2012 6:16

## 2014-08-03 IMAGING — CT CT HEAD WITHOUT CONTRAST
2 series · 16 of 30 positions shown, 20 images · non-contrast
Comparison: none

REASON FOR EXAM: fall
COMMENTS:

PROCEDURE:     CT  - CT HEAD WITHOUT CONTRAST  - January 17, 2012  [DATE]
RESULT:     Technique: Helical noncontrasted 5 mm sections were obtained
from the skull base through the vertex.

[Series 2: soft tissue · axial · 0.46mm/px · z∈[-215,-90]mm · 13 of 31 slices shown, 17 images]
[im 3/31  brain]
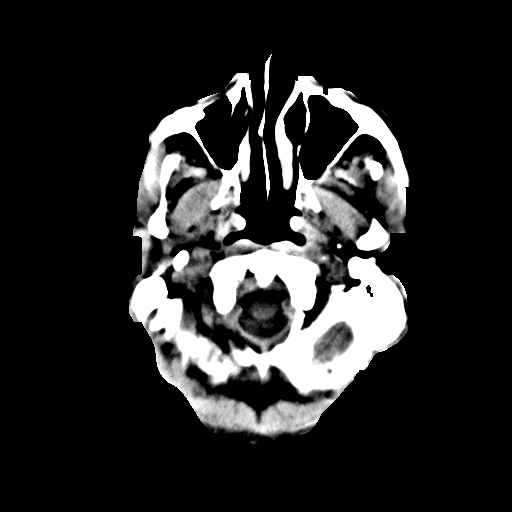
[im 3/31  bone]
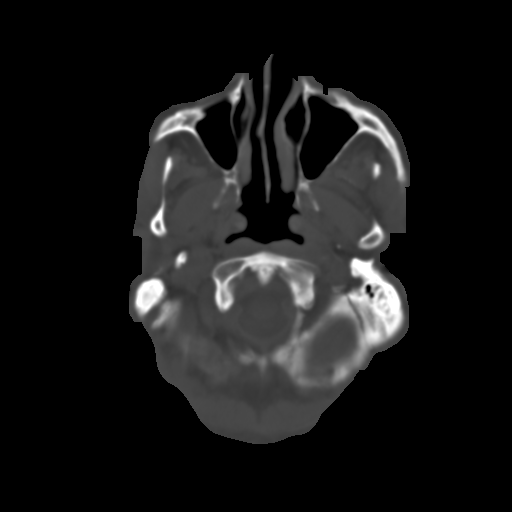
[im 5/31  brain]
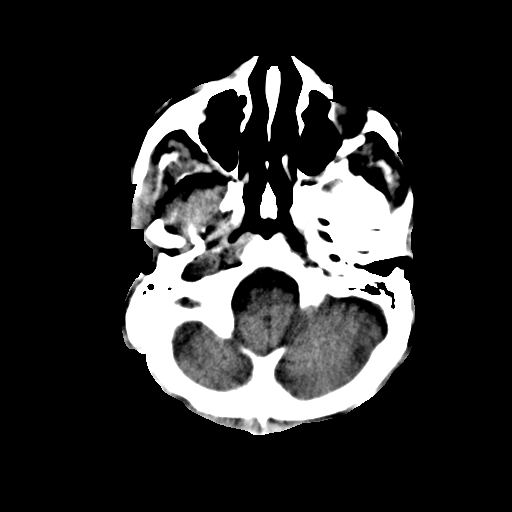
[im 7/31  brain]
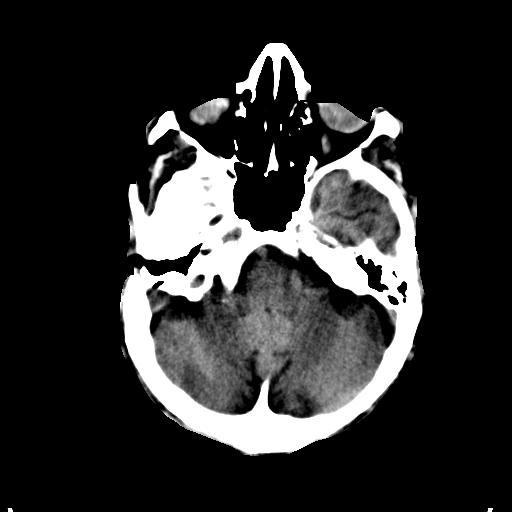
[im 9/31  brain]
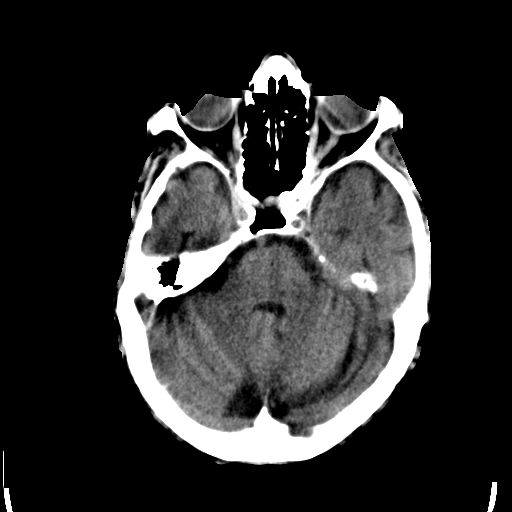
[im 11/31  brain]
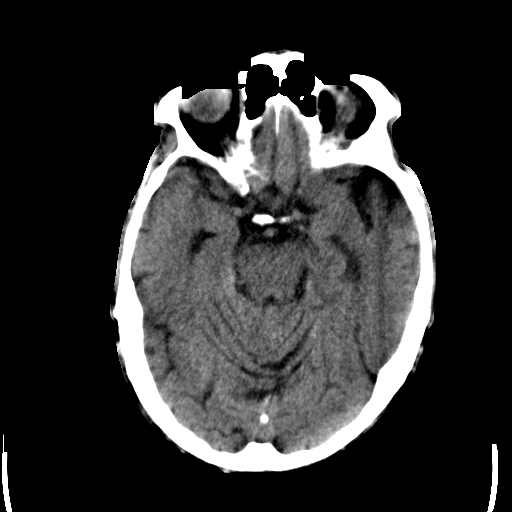
[im 11/31  bone]
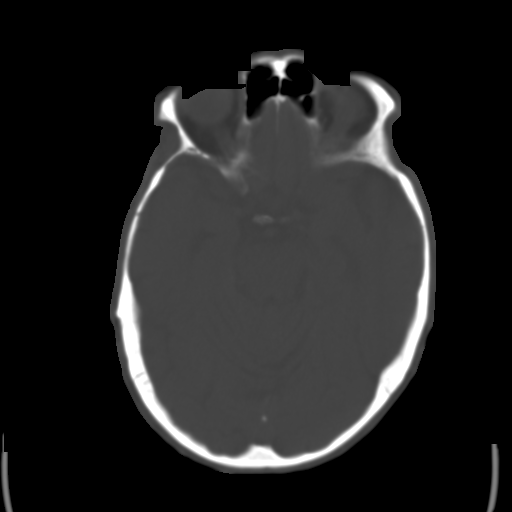
[im 13/31  brain]
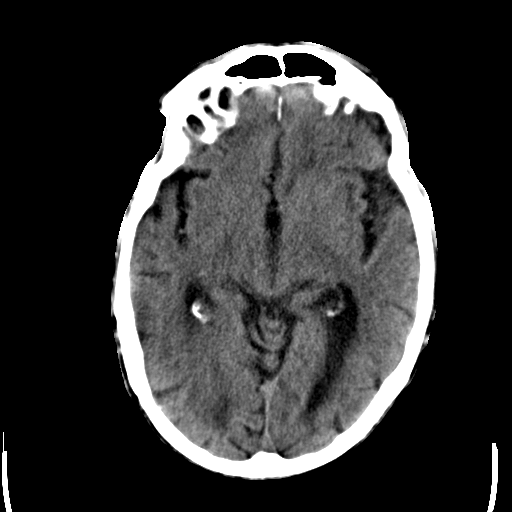
[im 16/31  brain]
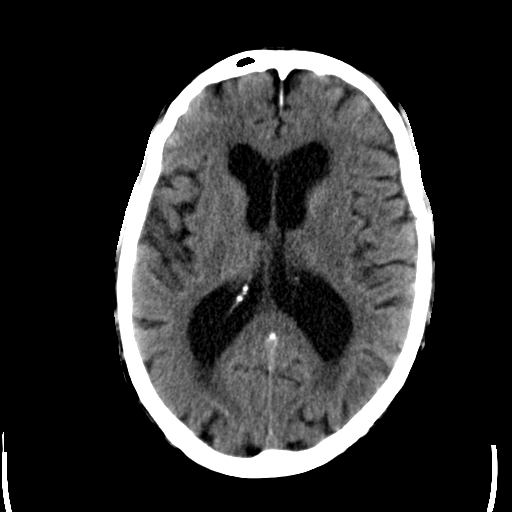
[im 18/31  brain]
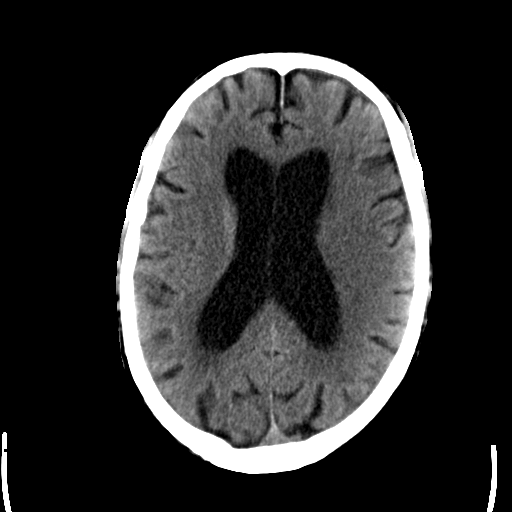
[im 20/31  brain]
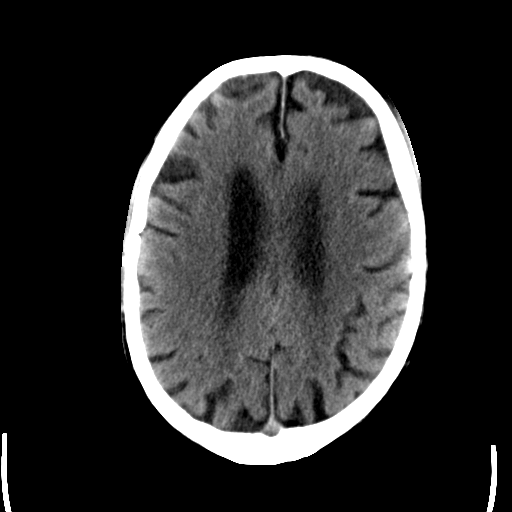
[im 20/31  bone]
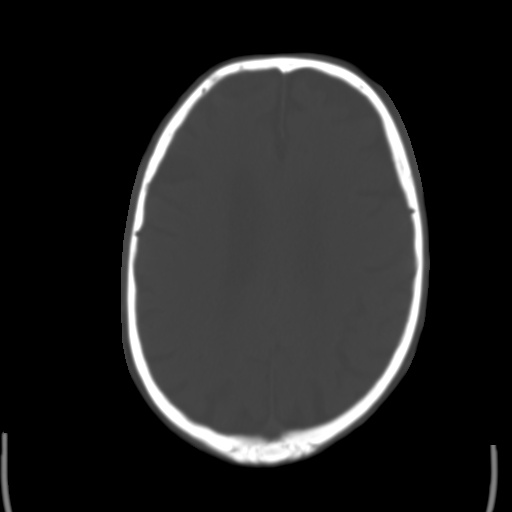
[im 22/31  brain]
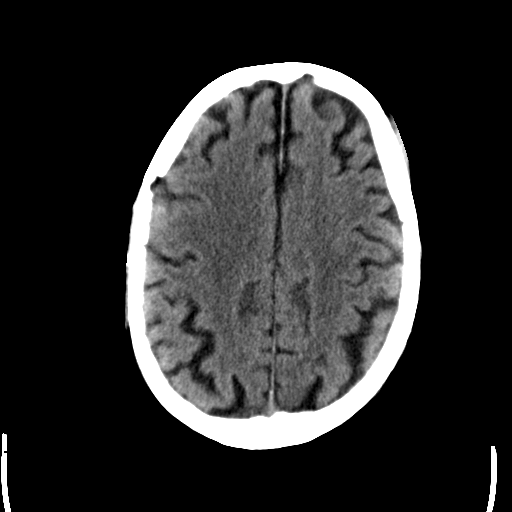
[im 24/31  brain]
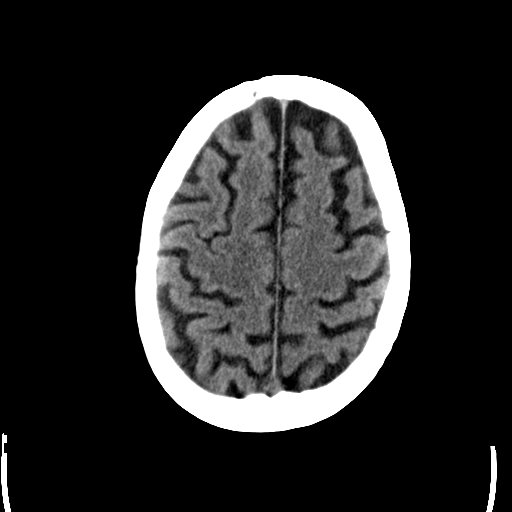
[im 26/31  brain]
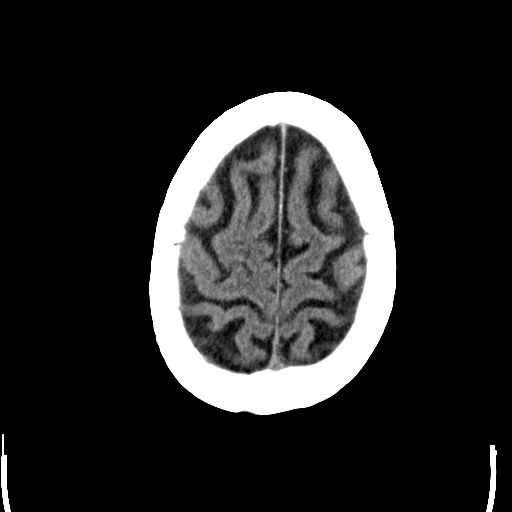
[im 28/31  brain]
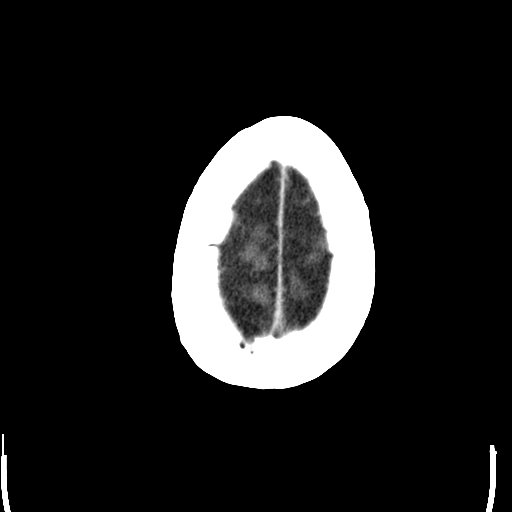
[im 28/31  bone]
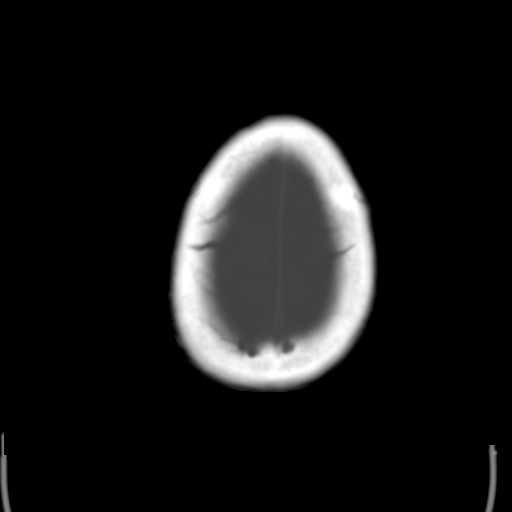

[Series 3: bone · axial · 0.46mm/px · z∈[-215,-170]mm · 3 of 32 slices shown]
[im 3/32  bone]
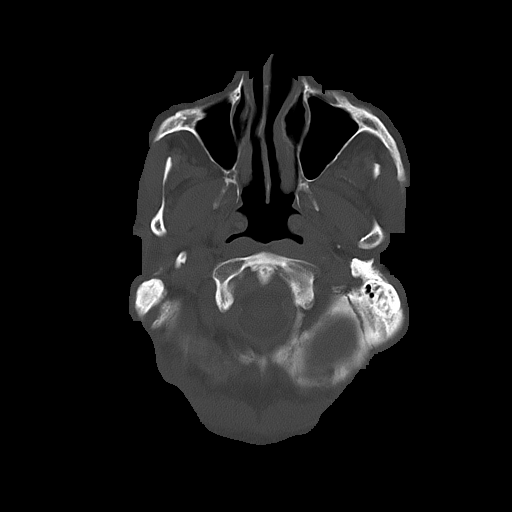
[im 7/32  bone]
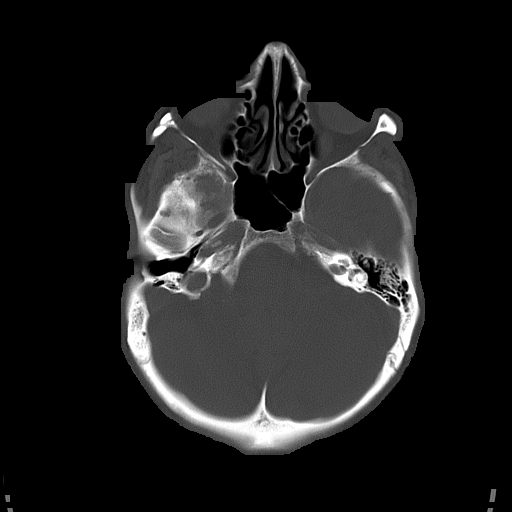
[im 12/32  bone]
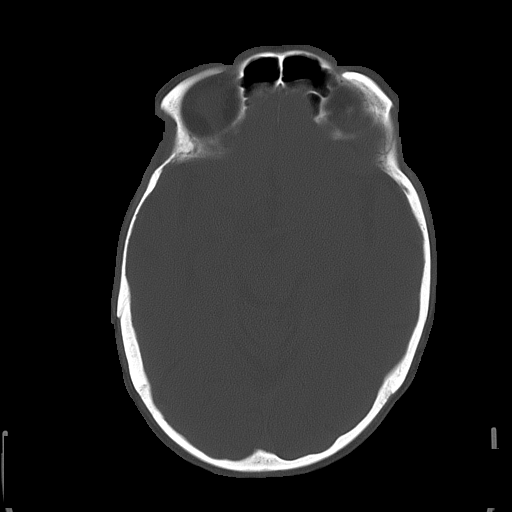

[16 of 30 positions shown; findings below may reference images not displayed]

FINDINGS: Diffuse cortical and cerebellar atrophy is identified as well as
diffuse areas of low attenuation within the subcortical, deep and
periventricular white matter regions. There is not evidence of intra-axial
nor extra-axial fluid collections, acute hemorrhage, mass effect, nor a
depressed skull fracture. The visualized paranasal sinuses and mastoid air
cells are patent. A scalp hematoma is identified within the left frontal
region.
IMPRESSION: Chronic and involutional changes without evidence of acute
abnormalities. If there is persistent clinical concern further evaluation
with MRI is recommended.

## 2014-10-27 IMAGING — CT CT CHEST W/O CM
1 series · 15 of 34 positions shown, 19 images · non-contrast
Comparison: none

REASON FOR EXAM: sob, left chest consolidation ? mass
COMMENTS:

[Series 2: soft tissue · axial · 0.65mm/px · z∈[-800,-534]mm · 15 of 105 slices shown, 19 images]
[im 8/105  mediastinal]
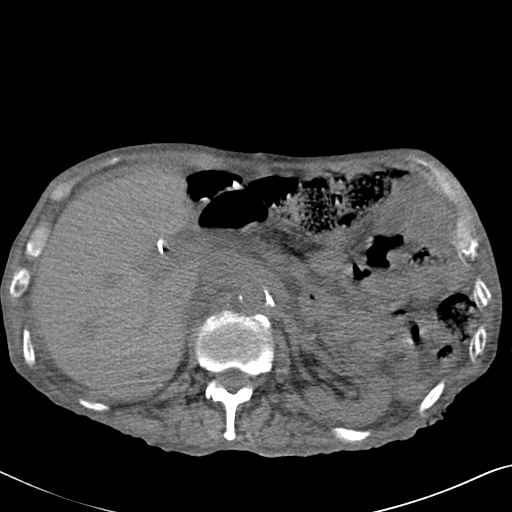
[im 8/105  lung]
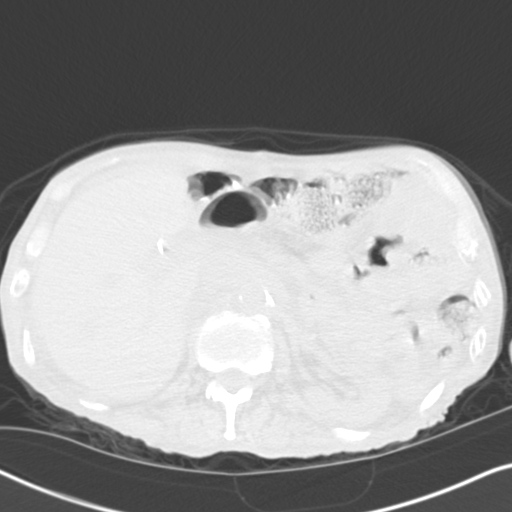
[im 16/105  lung]
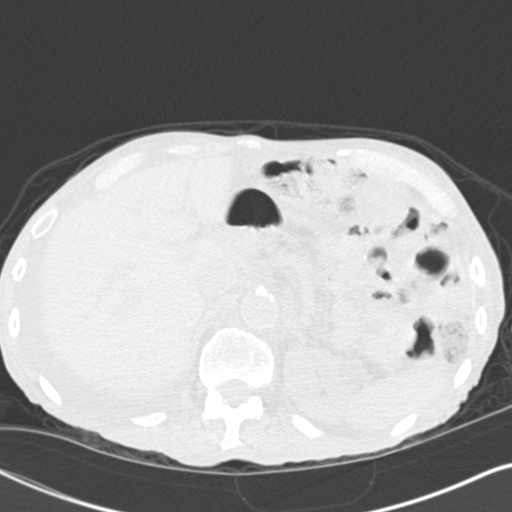
[im 21/105  lung]
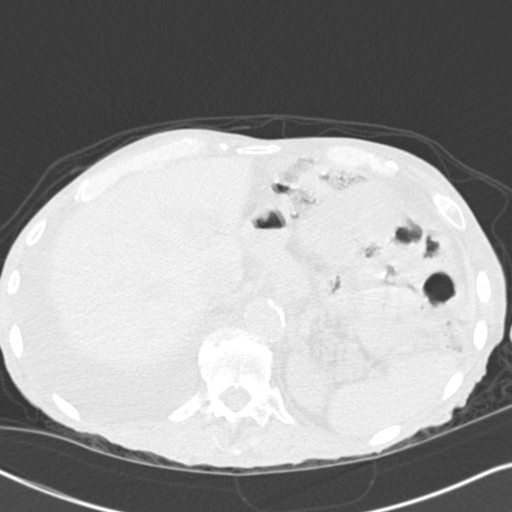
[im 27/105  lung]
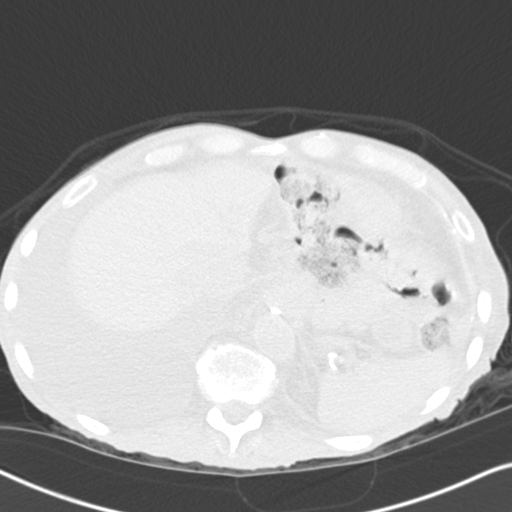
[im 35/105  mediastinal]
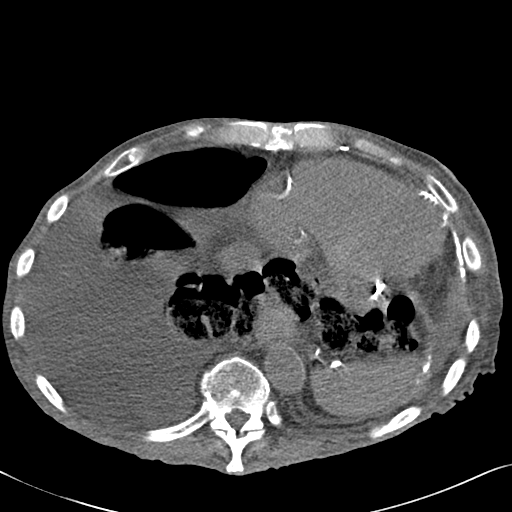
[im 35/105  lung]
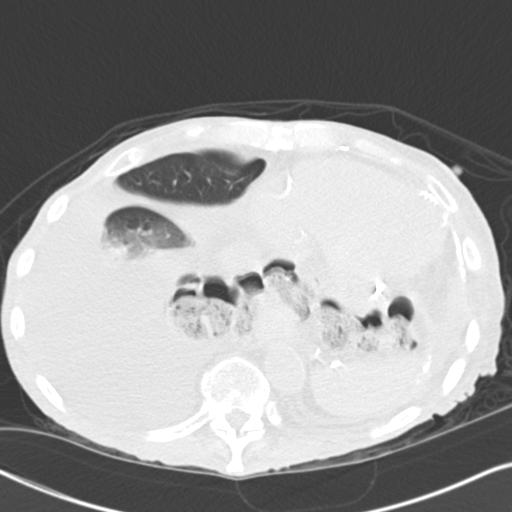
[im 42/105  lung]
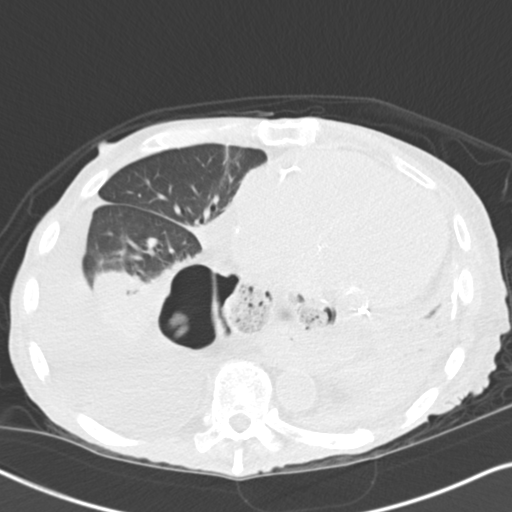
[im 47/105  lung]
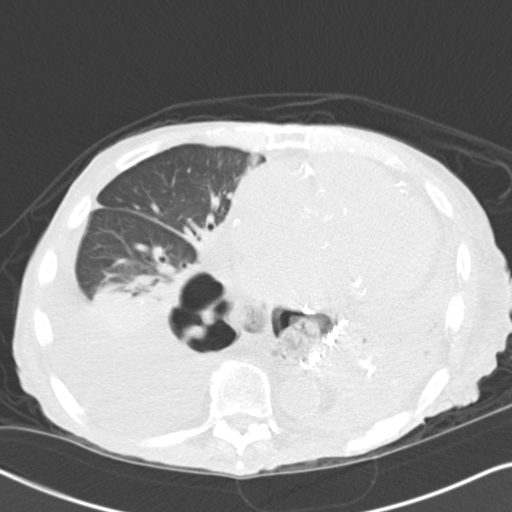
[im 54/105  lung]
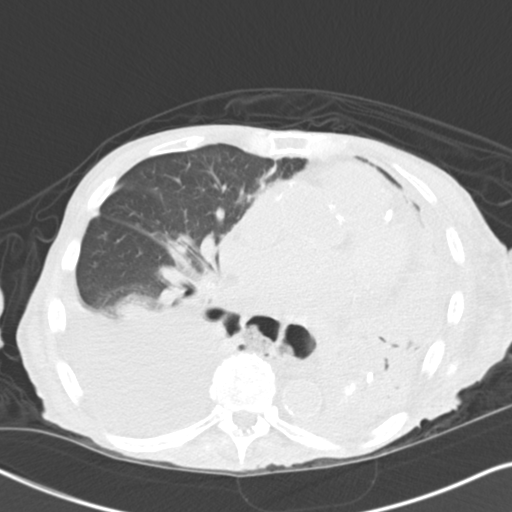
[im 58/105  mediastinal]
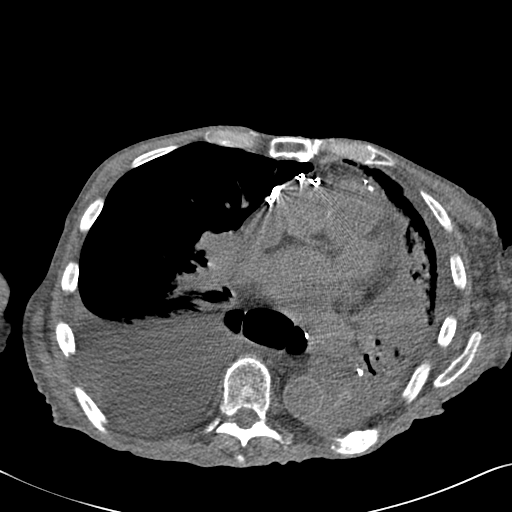
[im 58/105  lung]
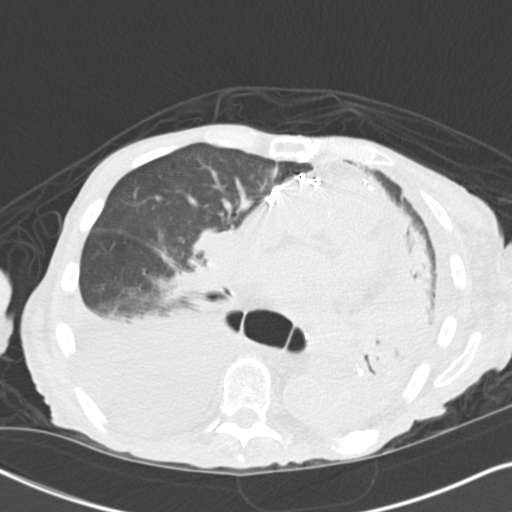
[im 63/105  lung]
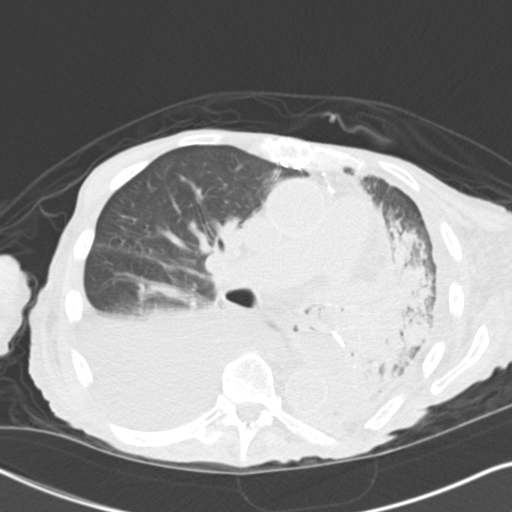
[im 70/105  lung]
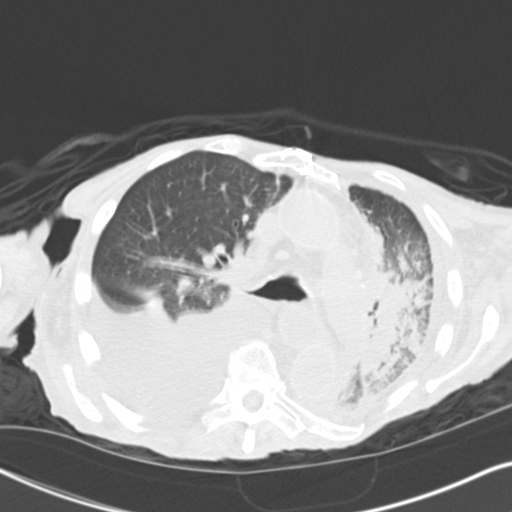
[im 78/105  lung]
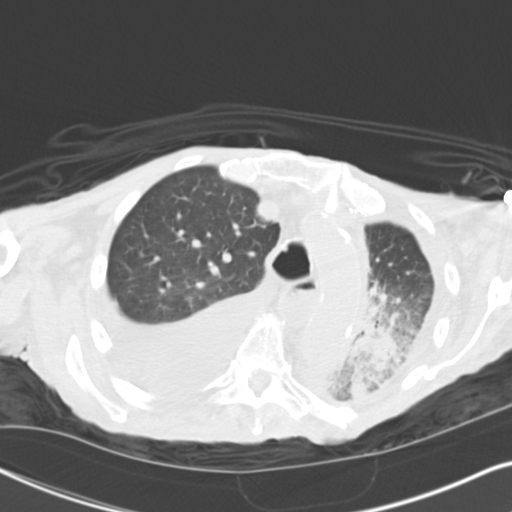
[im 84/105  mediastinal]
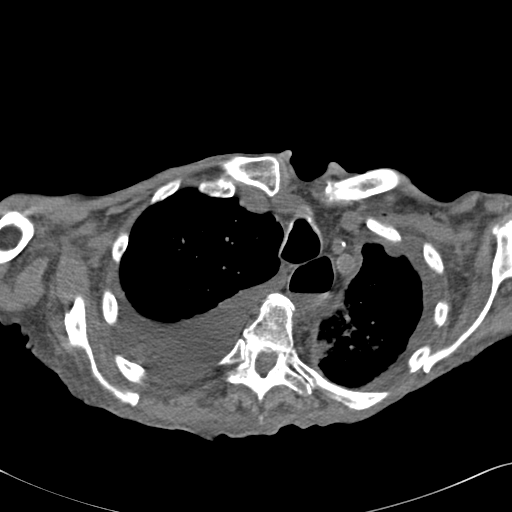
[im 84/105  lung]
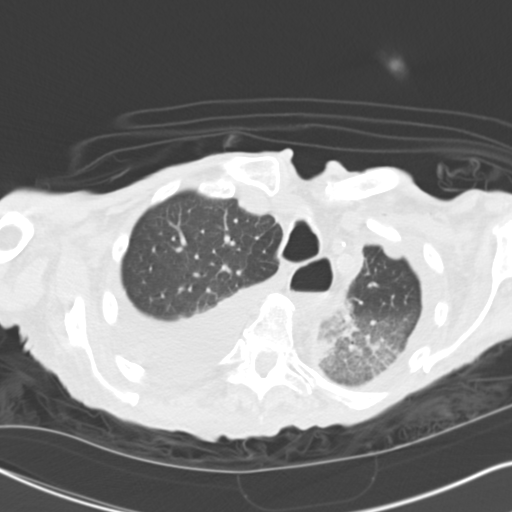
[im 89/105  lung]
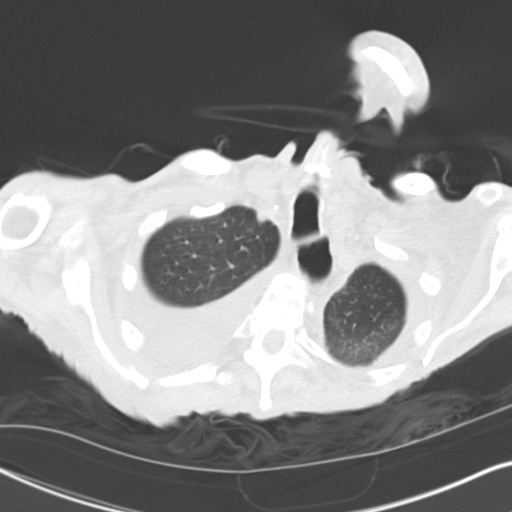
[im 97/105  lung]
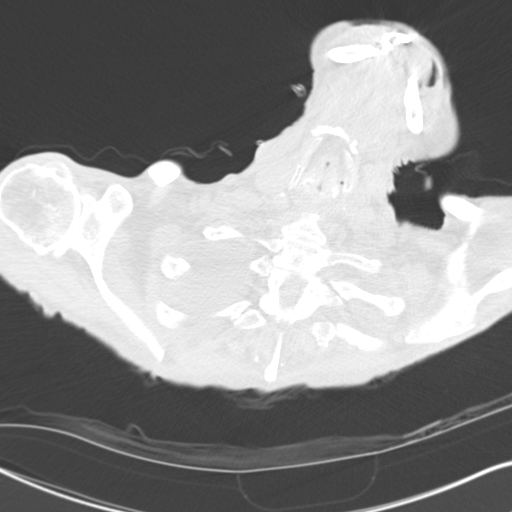

[15 of 34 positions shown; findings below may reference images not displayed]

PROCEDURE:     CT  - CT CHEST WITHOUT CONTRAST  - April 11, 2012  [DATE]

RESULT:     Chest CT without contrast is compared to previous examination
dated 22 March, 2011. There is a persistent moderately large right pleural
effusion with trace left pleural effusion. There is left lower lobe
consolidation with air bronchograms progressing since the previous exam.
There is patchy infiltrate in the left upper lobe extending into the lingula
where there is greater consolidation. Respiratory motion artifact is
present. There is compressive atelectasis in the right lower lobe and in the
right upper lobe. There is narrowing in the left mainstem bronchus which was
not present previously. This is best appreciated on images 39 to 43. The
heart is enlarged. The esophagus is distended with fluid and air. The distal
portion is poorly seen. Surgical clips are seen in the gastroesophageal
junction region. CABG changes are present. Portion of colon and stomach
appear to be present in the thoracic region posteriorly. Degenerative disc
narrowing is seen diffusely in the thoracic spine with mild compression
deformity involving the T10 vertebral body.
IMPRESSION: Moderate to large right pleural effusion persists. There is
herniation of the stomach and colon in the posterior thoracic region as
noted previously. There is progressive consolidation in the left lower lobe
with narrowing of the left mainstem bronchus. This is difficult to further
delineate given the lack of contrast. The heart remains enlarged. CABG
changes are present. The esophageal dilation is noted proximally. There is
compression deformity in the T10 vertebral body.

[REDACTED]
